# Patient Record
Sex: Female | Born: 2000 | Race: Black or African American | Hispanic: No | Marital: Single | Smoking: Never smoker
Health system: Southern US, Community
[De-identification: ages and names within clinical notes are randomized; demographics above are authoritative.]

---
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
## 2018-11-02 IMAGING — DX DG FOOT COMPLETE 3+V*R*
3 series · 3 of 3 positions shown · non-contrast
Comparison: None.

CLINICAL DATA: Right foot pain after injury 2 days ago on steps.

EXAM:
RIGHT FOOT COMPLETE - 3+ VIEW

[foot ap]
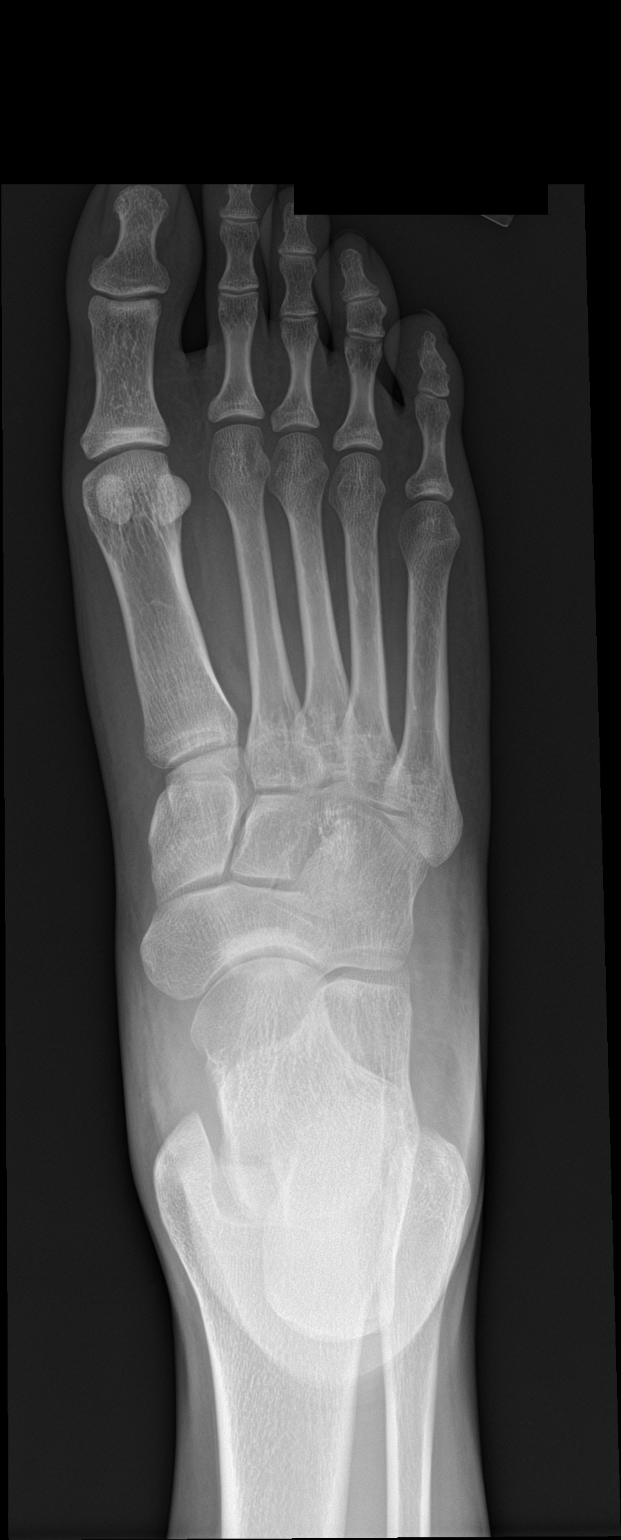

[foot obl]
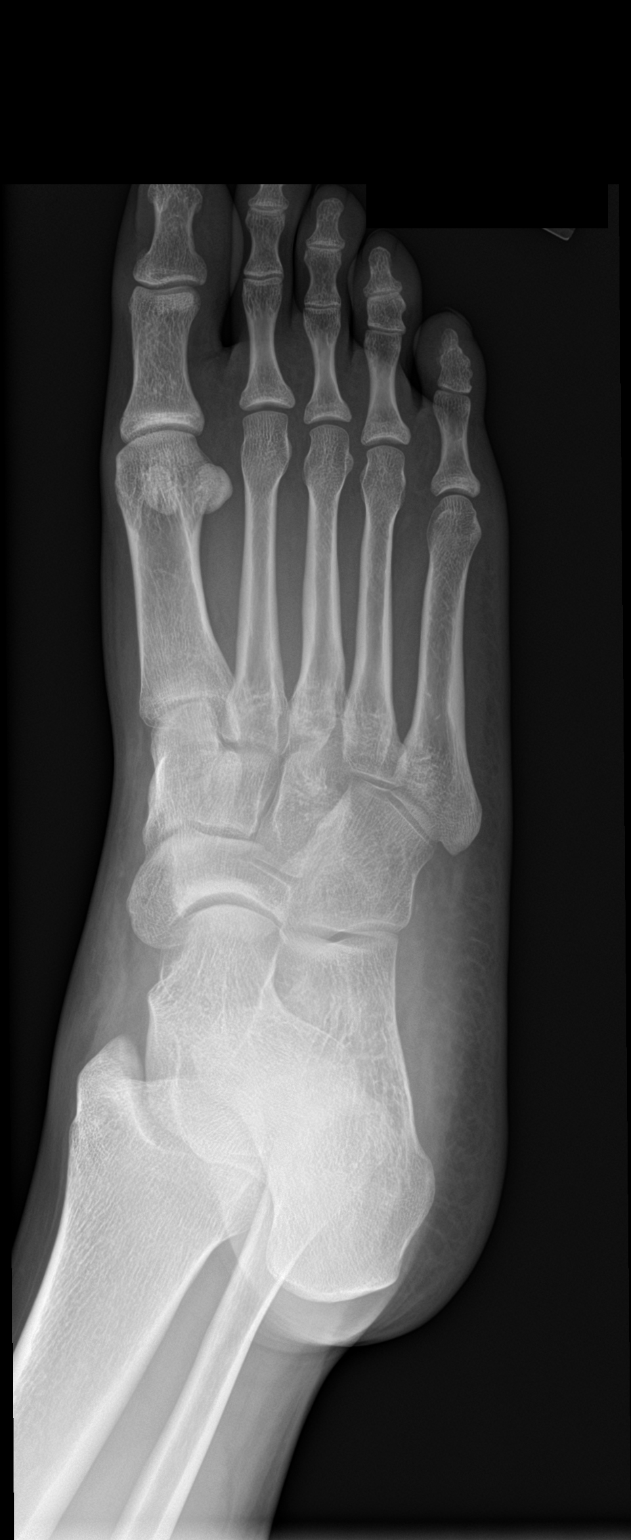

[foot lat]
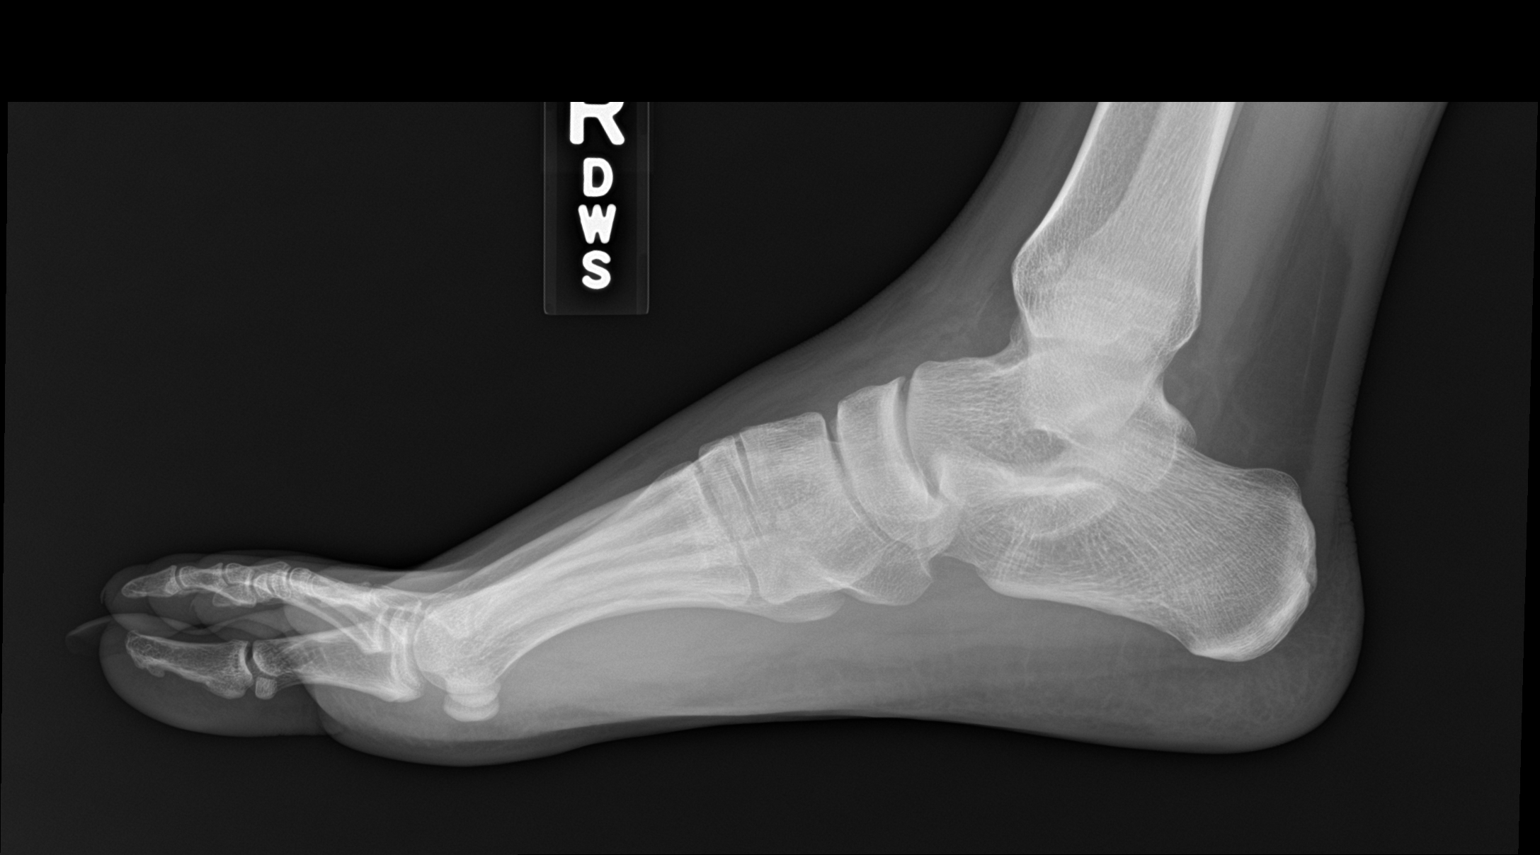

[3 of 3 positions shown; findings below may reference images not displayed]

FINDINGS: There is no evidence of fracture or dislocation. There is no
evidence of arthropathy or other focal bone abnormality. Soft
tissues are unremarkable.
IMPRESSION: Normal right foot.
